# Patient Record
Sex: Female | Born: 1975 | Race: Black or African American | Hispanic: No | Marital: Married | Smoking: Never smoker
Health system: Southern US, Community
[De-identification: ages and names within clinical notes are randomized; demographics above are authoritative.]

## PROBLEM LIST (undated history)

## (undated) DIAGNOSIS — O039 Complete or unspecified spontaneous abortion without complication: Secondary | ICD-10-CM

## (undated) HISTORY — DX: Complete or unspecified spontaneous abortion without complication: O03.9

---

## 2000-11-28 ENCOUNTER — Ambulatory Visit (HOSPITAL_COMMUNITY): Admission: RE | Admit: 2000-11-28 | Discharge: 2000-11-28 | Payer: Self-pay | Admitting: *Deleted

## 2001-04-26 ENCOUNTER — Inpatient Hospital Stay (HOSPITAL_COMMUNITY): Admission: AD | Admit: 2001-04-26 | Discharge: 2001-04-28 | Payer: Self-pay | Admitting: *Deleted

## 2002-02-28 ENCOUNTER — Ambulatory Visit (HOSPITAL_COMMUNITY): Admission: RE | Admit: 2002-02-28 | Discharge: 2002-02-28 | Payer: Self-pay | Admitting: *Deleted

## 2002-07-25 ENCOUNTER — Inpatient Hospital Stay (HOSPITAL_COMMUNITY): Admission: AD | Admit: 2002-07-25 | Discharge: 2002-07-25 | Payer: Self-pay | Admitting: Obstetrics and Gynecology

## 2002-07-25 ENCOUNTER — Inpatient Hospital Stay (HOSPITAL_COMMUNITY): Admission: AD | Admit: 2002-07-25 | Discharge: 2002-07-28 | Payer: Self-pay | Admitting: Obstetrics & Gynecology

## 2009-01-19 ENCOUNTER — Ambulatory Visit: Payer: Self-pay | Admitting: Internal Medicine

## 2010-03-10 ENCOUNTER — Other Ambulatory Visit: Payer: Self-pay | Admitting: Specialist

## 2010-03-10 DIAGNOSIS — R1011 Right upper quadrant pain: Secondary | ICD-10-CM

## 2010-03-17 ENCOUNTER — Ambulatory Visit
Admission: RE | Admit: 2010-03-17 | Discharge: 2010-03-17 | Disposition: A | Discharge status: Home / Self Care | Payer: Medicaid Other | Source: Ambulatory Visit | Attending: Specialist | Admitting: Specialist

## 2010-03-17 DIAGNOSIS — R1011 Right upper quadrant pain: Secondary | ICD-10-CM

## 2010-03-25 ENCOUNTER — Other Ambulatory Visit: Payer: Self-pay | Admitting: Specialist

## 2010-03-25 DIAGNOSIS — R7989 Other specified abnormal findings of blood chemistry: Secondary | ICD-10-CM

## 2010-03-29 ENCOUNTER — Ambulatory Visit
Admission: RE | Admit: 2010-03-29 | Discharge: 2010-03-29 | Disposition: A | Discharge status: Home / Self Care | Payer: Medicaid Other | Source: Ambulatory Visit | Attending: Specialist | Admitting: Specialist

## 2010-03-29 DIAGNOSIS — R7989 Other specified abnormal findings of blood chemistry: Secondary | ICD-10-CM

## 2012-09-12 IMAGING — US US ABDOMEN COMPLETE
1 series · 14 of 25 positions shown · non-contrast
Comparison: None.

CLINICAL DATA: Right upper quadrant abdominal pain, some pelvic
pain, difficulty urinating

COMPLETE ABDOMINAL ULTRASOUND

[Series 1: us abdomen complete · 14 of 87 slices shown]
[im 1/87]
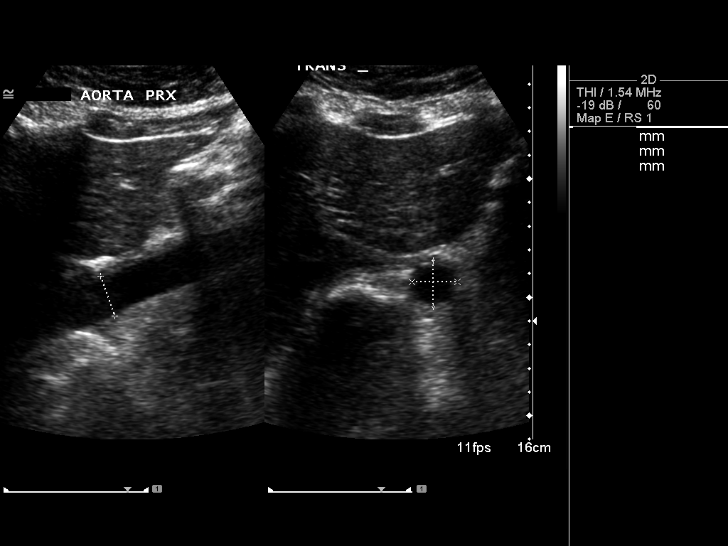
[im 8/87]
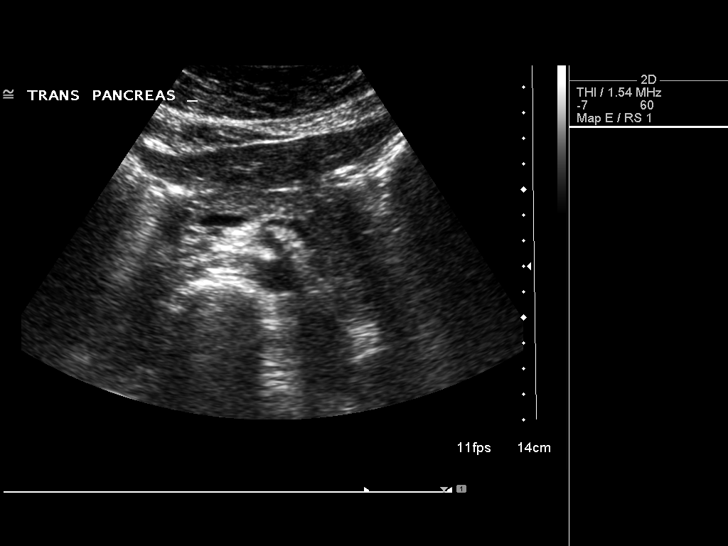
[im 15/87]
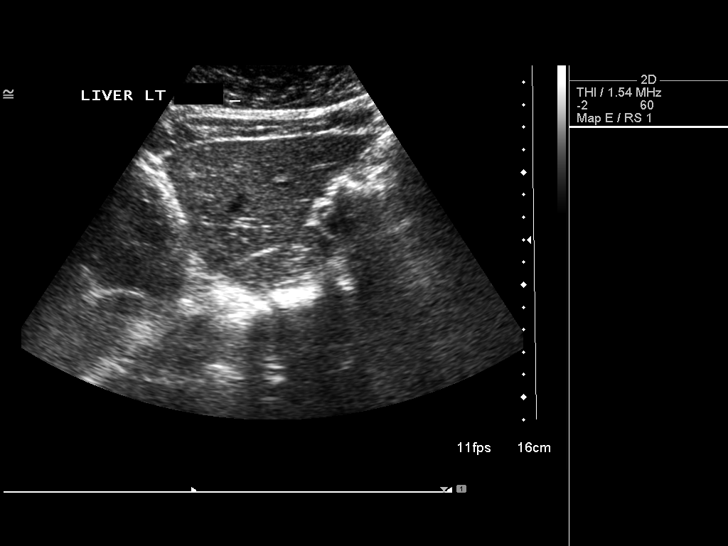
[im 22/87]
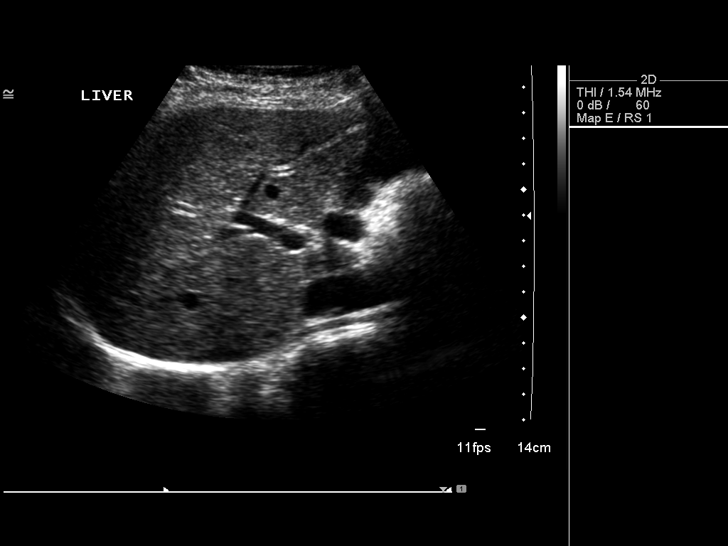
[im 29/87]
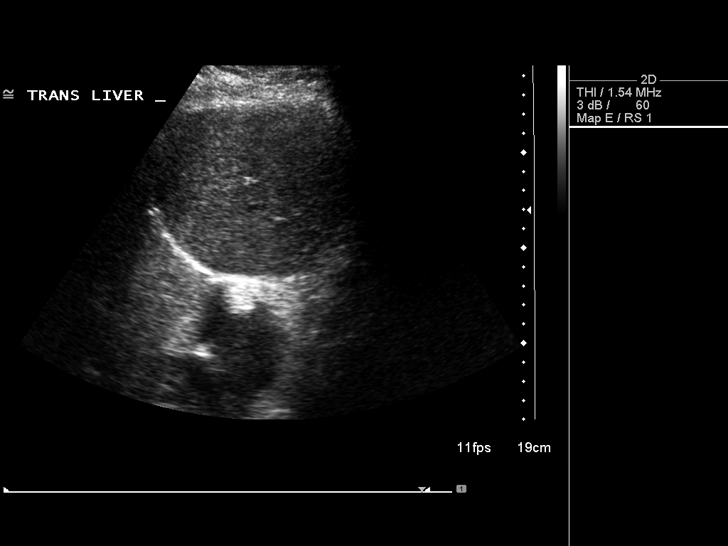
[im 33/87]
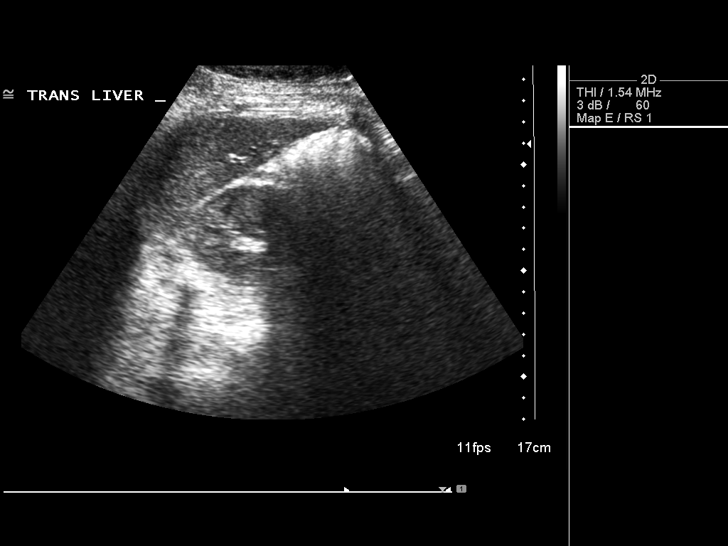
[im 40/87]
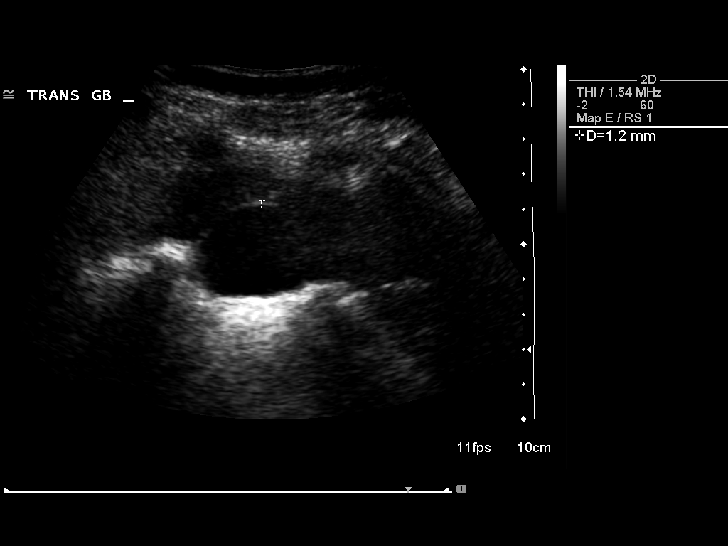
[im 47/87]
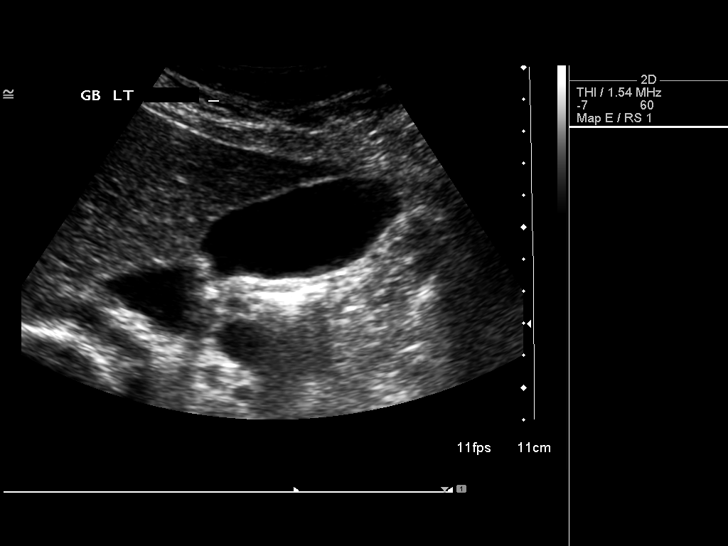
[im 54/87]
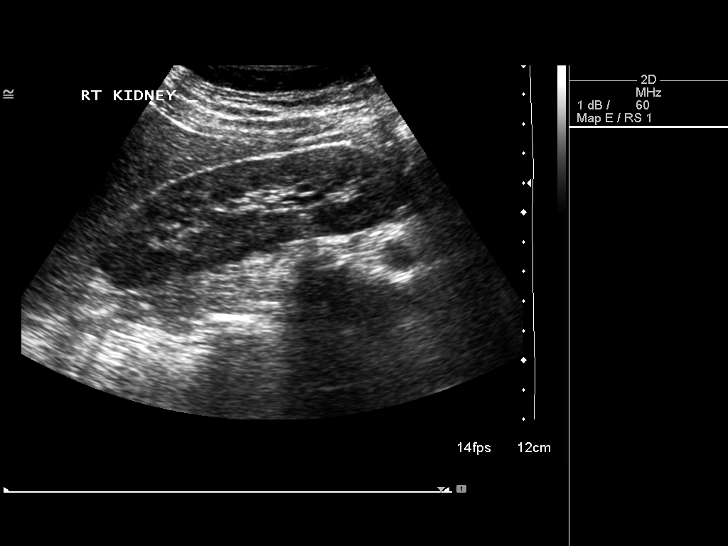
[im 58/87]
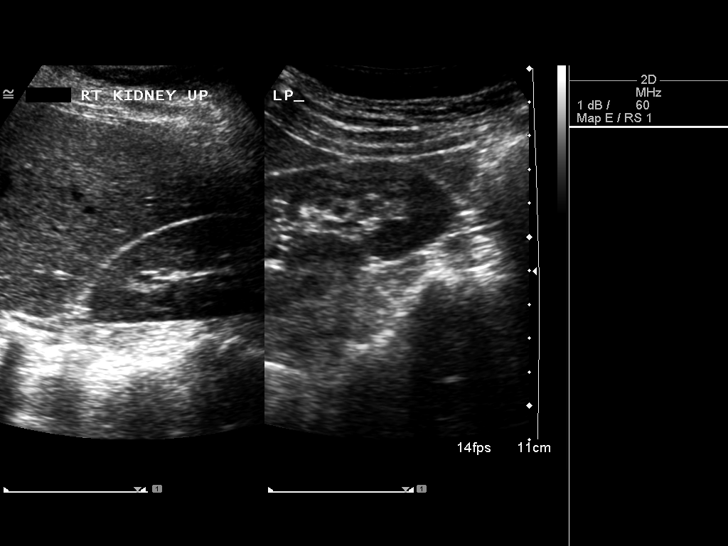
[im 65/87]
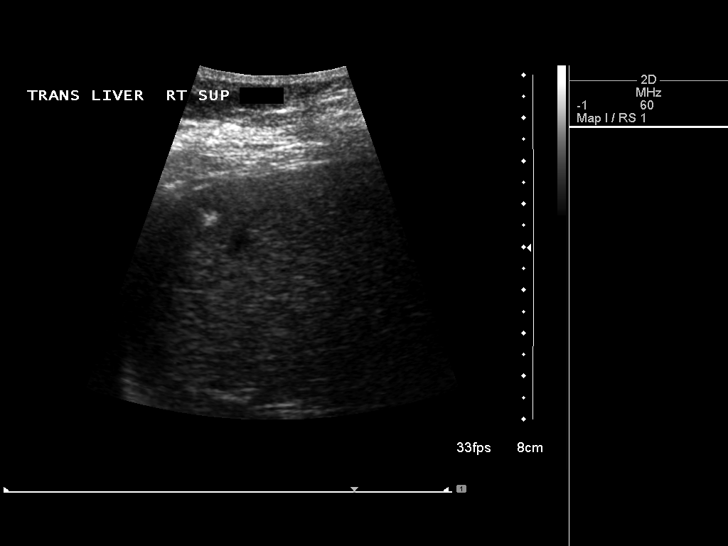
[im 72/87]
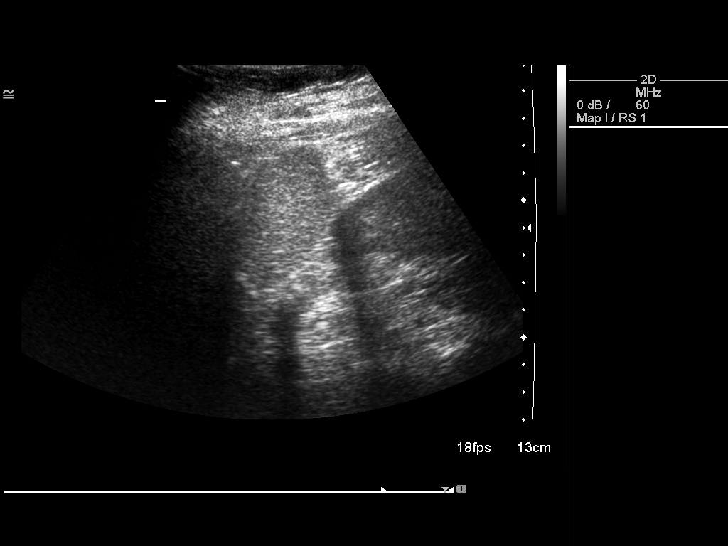
[im 79/87]
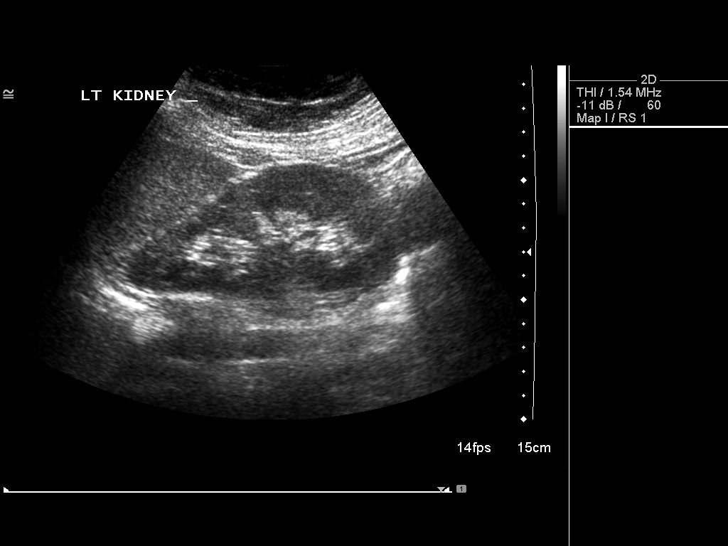
[im 87/87]
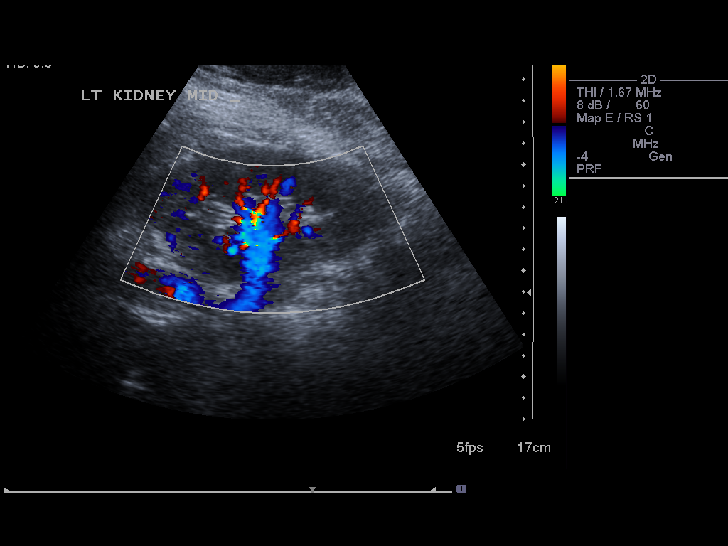

[14 of 25 positions shown; findings below may reference images not displayed]

FINDINGS: Gallbladder:  The gallbladder is visualized and no gallstones are
noted.

Common bile duct:  The common bile duct is normal measuring 5.1 mm
in diameter.

Liver:  The liver appears normal other than a single small
hyperechogenic focus superiorly in the right lobe of 5 mm most
consistent with small hemangioma.  No ductal dilatation is seen.

IVC:  Appears normal.

Pancreas:  The pancreas is partially obscured by bowel gas,
particularly the head and tail of the pancreas.

Spleen:  The spleen is normal measuring 6.3 cm sagittally.

Right Kidney:  No hydronephrosis is seen.  The right kidney
measures 10.8 cm sagittally.There is minimal fullness of the right
pelvis measuring 7 mm in diameter.

Left Kidney:  No hydronephrosis.  The left kidney measures 11.5 cm.

Abdominal aorta:  The abdominal aorta is normal in caliber.
IMPRESSION: 1.  Minimal fullness of the right renal pelvis of questionable
significance.
2.  Probable small hemangioma in the liver of 5 mm in diameter.
3.  No gallstones.

## 2012-09-24 IMAGING — US US SOFT TISSUE HEAD/NECK
1 series · 14 of 25 positions shown · non-contrast
Comparison: None.

CLINICAL DATA: Low TSH.

THYROID ULTRASOUND
TECHNIQUE: Ultrasound examination of the thyroid gland and adjacent
soft tissues was performed.

[Series 1: us soft tissue head/neck · 0.06mm/px · 14 of 36 slices shown]
[im 1/36]
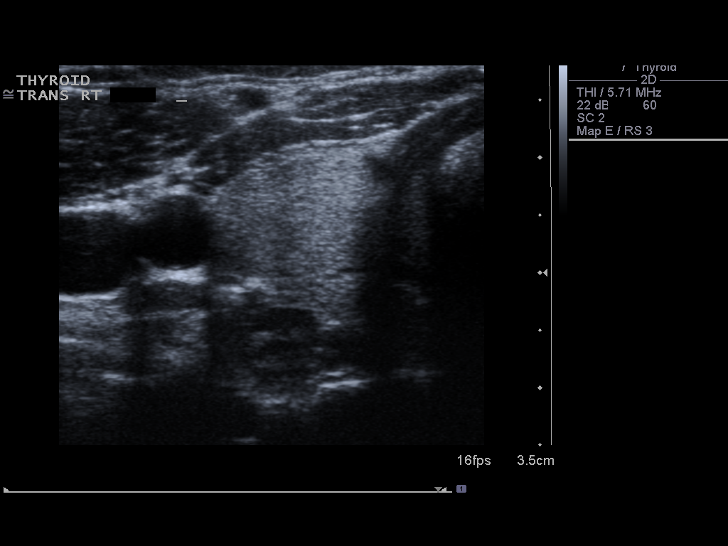
[im 3/36]
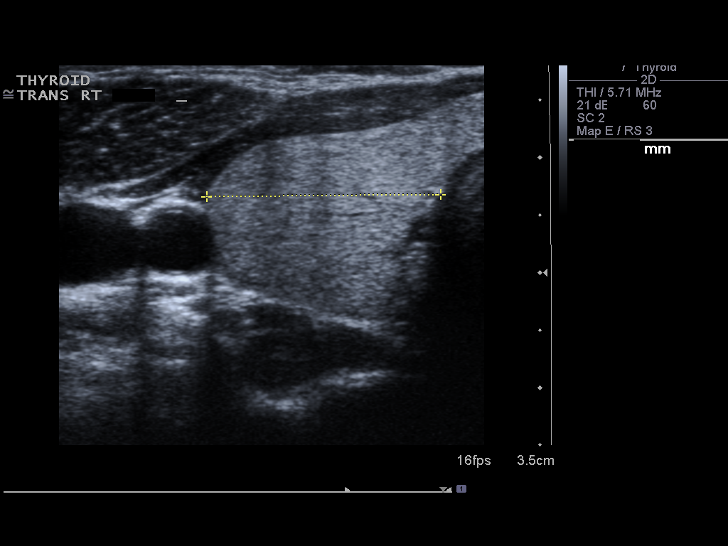
[im 6/36]
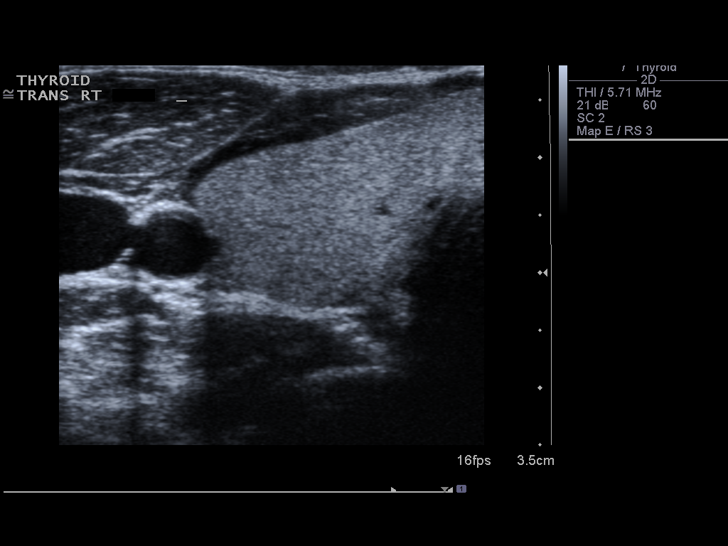
[im 9/36]
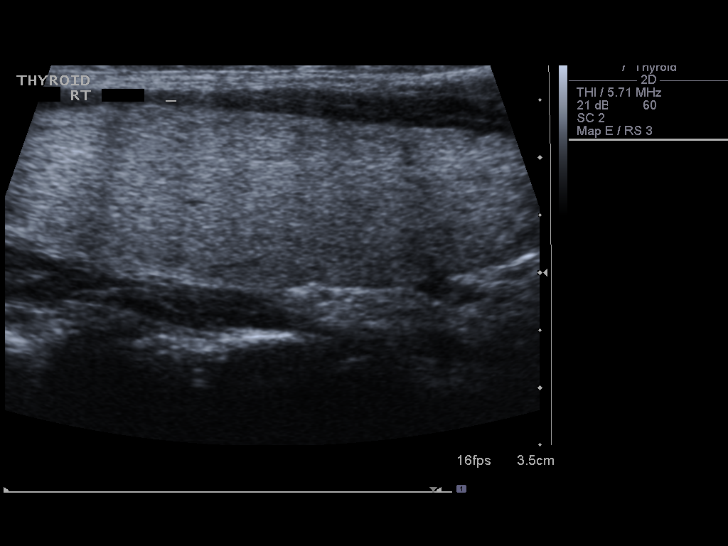
[im 12/36]
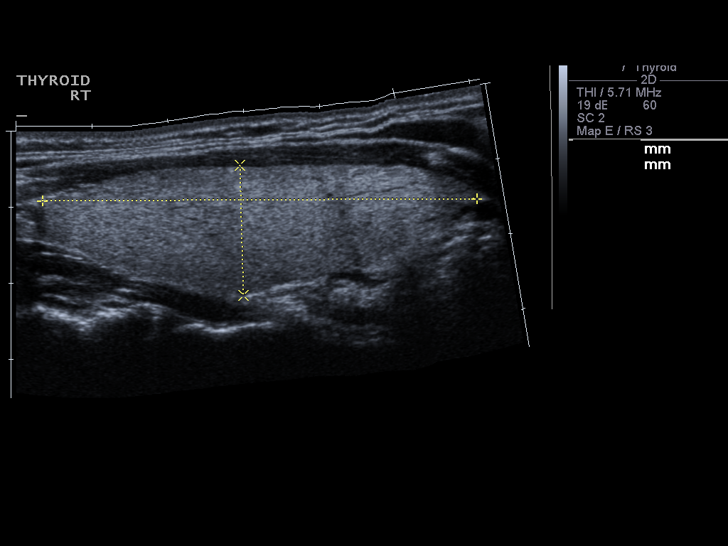
[im 14/36]
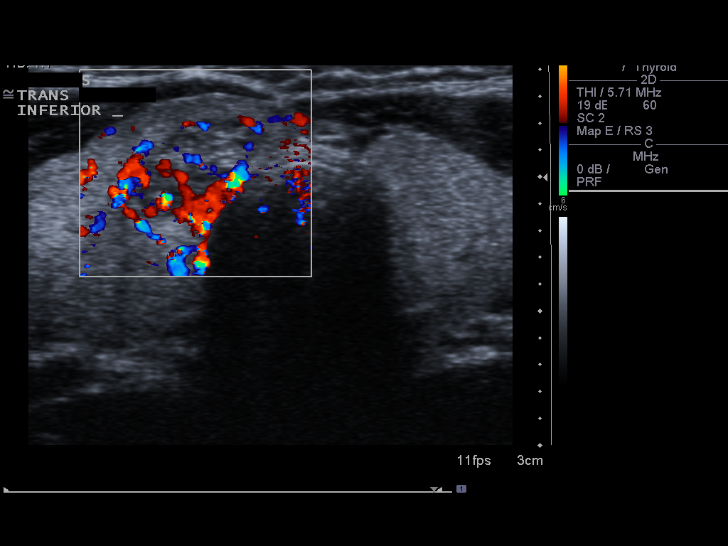
[im 17/36]
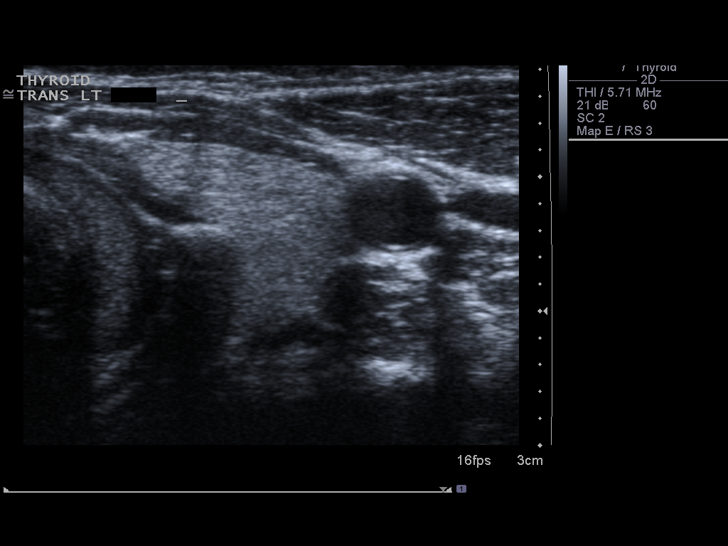
[im 19/36]
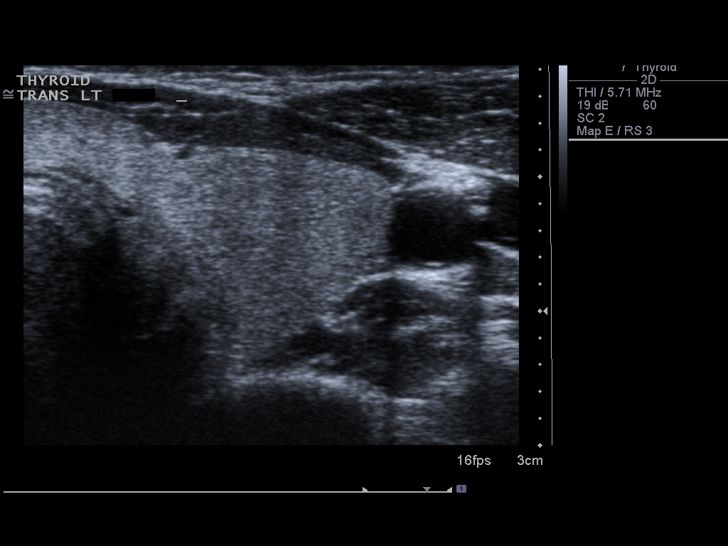
[im 22/36]
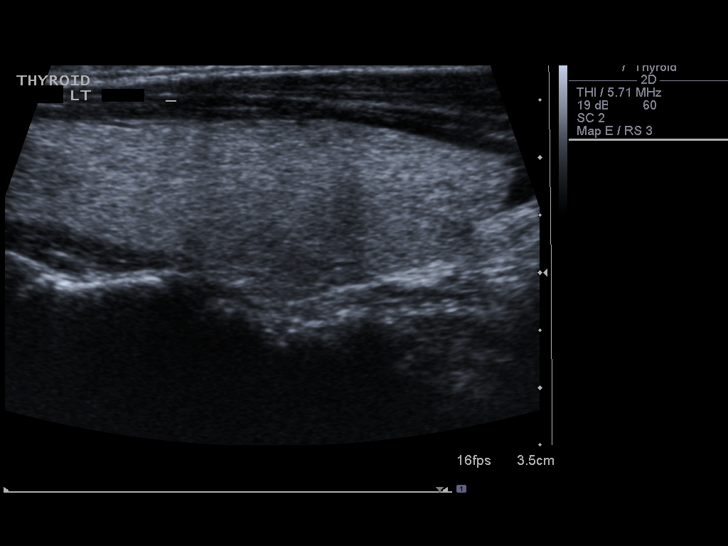
[im 24/36]
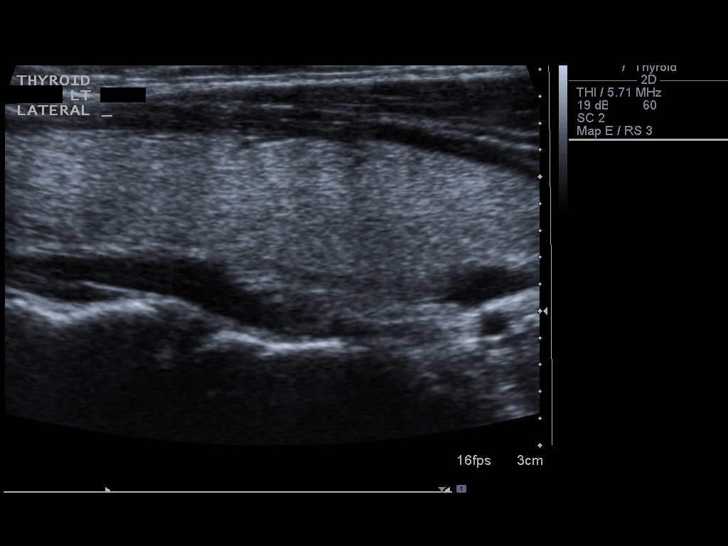
[im 27/36]
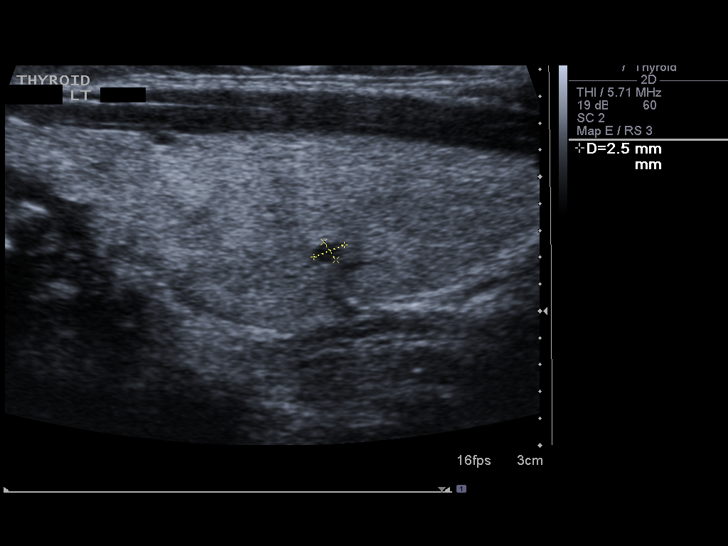
[im 30/36]
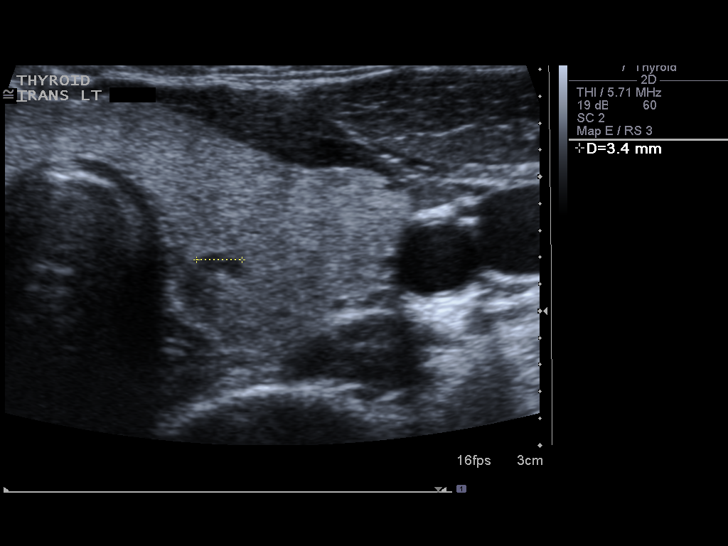
[im 33/36]
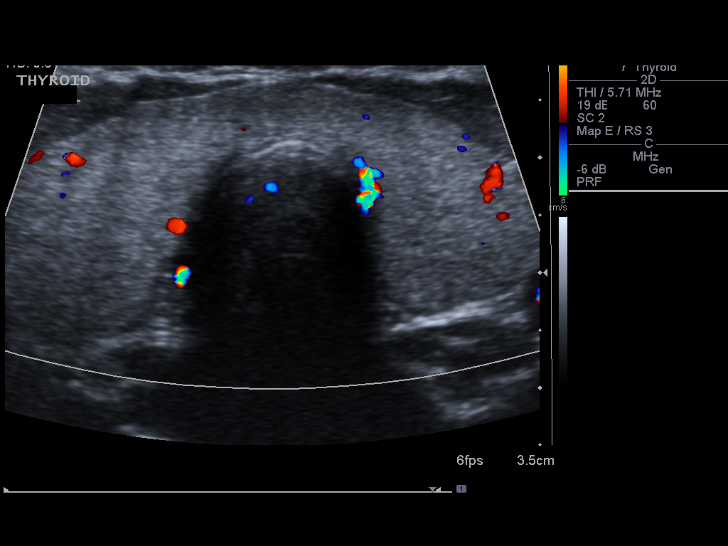
[im 36/36]
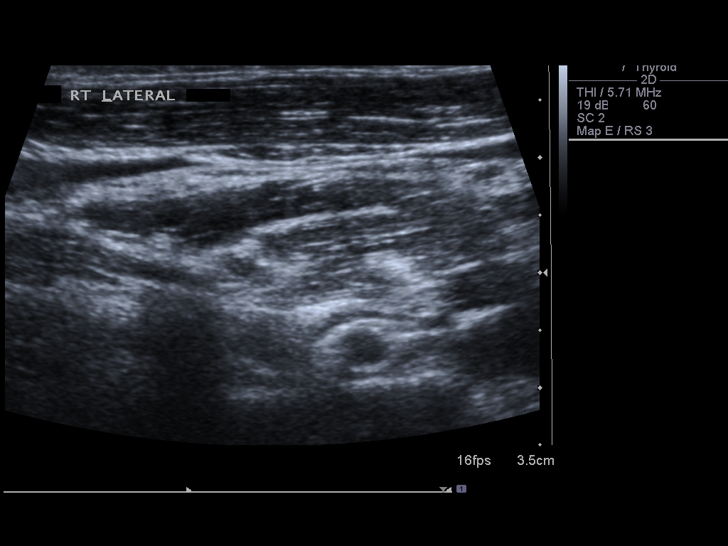

[14 of 25 positions shown; findings below may reference images not displayed]

FINDINGS: Right thyroid lobe:  5.7 x 1.7 x 2.0 cm
Left thyroid lobe:  4.4 x 1.7 x 1.9 cm
Isthmus:  5 mm

Focal nodules:  Thyroid echotexture is primarily homogeneous.
Within the interpolar right lobe, a solid hypoechoic lesion
measures 1.4 x 0.8 by 1.0 cm.  Hypervascular.  No calcifications
within.

A cystic lesion in the interpolar left lobe measures 3 mm and is of
doubtful clinical significance.

Lymphadenopathy:  None visualized.
IMPRESSION: Dominant nodule within the right lobe of the thyroid.  Given the
history of decreased TSH level, consider nuclear medicine
scintigraphy.  If this is not performed, ultrasound surveillance of
this lesion is recommended at approximately 6 months.

## 2017-04-26 DIAGNOSIS — L301 Dyshidrosis [pompholyx]: Secondary | ICD-10-CM | POA: Insufficient documentation

## 2017-04-26 DIAGNOSIS — E789 Disorder of lipoprotein metabolism, unspecified: Secondary | ICD-10-CM | POA: Insufficient documentation

## 2017-04-26 DIAGNOSIS — I8393 Asymptomatic varicose veins of bilateral lower extremities: Secondary | ICD-10-CM | POA: Insufficient documentation

## 2017-04-26 DIAGNOSIS — E041 Nontoxic single thyroid nodule: Secondary | ICD-10-CM | POA: Insufficient documentation

## 2019-05-08 ENCOUNTER — Other Ambulatory Visit (HOSPITAL_COMMUNITY)
Admission: RE | Admit: 2019-05-08 | Discharge: 2019-05-08 | Disposition: A | Discharge status: Home / Self Care | Payer: Federal, State, Local not specified - PPO | Source: Ambulatory Visit | Attending: Obstetrics and Gynecology | Admitting: Obstetrics and Gynecology

## 2019-05-08 ENCOUNTER — Other Ambulatory Visit: Payer: Self-pay

## 2019-05-08 ENCOUNTER — Ambulatory Visit: Payer: Federal, State, Local not specified - PPO | Admitting: Obstetrics and Gynecology

## 2019-05-08 ENCOUNTER — Encounter: Payer: Self-pay | Admitting: Obstetrics and Gynecology

## 2019-05-08 VITALS — BP 118/76 | HR 80 | Temp 97.9°F | Resp 10 | Ht 62.75 in | Wt 197.4 lb

## 2019-05-08 DIAGNOSIS — Z124 Encounter for screening for malignant neoplasm of cervix: Secondary | ICD-10-CM | POA: Insufficient documentation

## 2019-05-08 DIAGNOSIS — Z3041 Encounter for surveillance of contraceptive pills: Secondary | ICD-10-CM

## 2019-05-08 DIAGNOSIS — Z01419 Encounter for gynecological examination (general) (routine) without abnormal findings: Secondary | ICD-10-CM | POA: Diagnosis not present

## 2019-05-08 DIAGNOSIS — Z0000 Encounter for general adult medical examination without abnormal findings: Secondary | ICD-10-CM | POA: Diagnosis not present

## 2019-05-08 MED ORDER — ETHYNODIOL DIAC-ETH ESTRADIOL 1-35 MG-MCG PO TABS: 1.0000 | ORAL_TABLET | Freq: Every day | ORAL | 3 refills | Status: DC

## 2019-05-08 NOTE — Patient Instructions (Signed)

## 2019-05-08 NOTE — Progress Notes (Signed)
44 y.o. [id number] Married Black or African American Not Hispanic or Latino female here as a new patient for an annual exam.   Period Cycle (Days): 28 Period Duration (Days): 5-6 Period Pattern: Regular Menstrual Flow: Moderate Menstrual Control: Thin pad Menstrual Control Change Freq (Hours): 5 Dysmenorrhea: None  Patient's last menstrual period was 04/23/2019 (approximate).          Sexually active: Yes.    The current method of family planning is none.    Exercising: No.  The patient does not participate in regular exercise at present. Smoker:  no  Health Maintenance: Pap:  About 2015 per patient - normal  History of abnormal Pap:  no MMG:  Never -- patient declines BMD:   never Colonoscopy: never TDaP: about 2015 during pregnancy Gardasil: never   reports that she has never smoked. She has never used smokeless tobacco. She reports that she does not drink alcohol or use drugs. Homemaker, kids are [phone] and 5  Past Medical History:  Diagnosis Date  . Miscarriage     History reviewed. No pertinent surgical history.  Current Outpatient Medications  Medication Sig Dispense Refill  . ethynodiol-ethinyl estradiol (ZOVIA) 1-35 MG-MCG tablet Take 1 tablet by mouth daily.     No current facility-administered medications for this visit.    Family History  Problem Relation Age of Onset  . Hypertension Father     Review of Systems  Constitutional: Negative.   HENT: Negative.   Eyes: Negative.   Respiratory: Negative.   Cardiovascular: Negative.   Gastrointestinal: Negative.   Endocrine: Negative.   Genitourinary: Negative.   Musculoskeletal: Negative.   Skin: Negative.   Allergic/Immunologic: Negative.   Neurological: Negative.   Hematological: Negative.   Psychiatric/Behavioral: Negative.     Exam:   BP 118/76 (BP Location: Right Arm, Patient Position: Sitting, Cuff Size: Normal)   Pulse 80   Temp 97.9 F (36.6 C) (Temporal)   Resp 10   Ht 5' 2.75" (1.594  m)   Wt 197 lb 6.4 oz (89.5 kg)   LMP 04/23/2019 (Approximate)   BMI 35.25 kg/m   Weight change: @WEIGHTCHANGE @ Height:   Height: 5' 2.75" (159.4 cm)  Ht Readings from Last 3 Encounters:  05/08/19 5' 2.75" (1.594 m)    General appearance: alert, cooperative and appears stated age Head: Normocephalic, without obvious abnormality, atraumatic Neck: no adenopathy, supple, symmetrical, trachea midline and thyroid normal to inspection and palpation Lungs: clear to auscultation bilaterally Cardiovascular: regular rate and rhythm Breasts: normal appearance, no masses or tenderness Abdomen: soft, non-tender; non distended,  no masses,  no organomegaly Extremities: extremities normal, atraumatic, no cyanosis or edema Skin: Skin color, texture, turgor normal. No rashes or lesions Lymph nodes: Cervical, supraclavicular, and axillary nodes normal. No abnormal inguinal nodes palpated Neurologic: Grossly normal   Pelvic: External genitalia:  no lesions              Urethra:  normal appearing urethra with no masses, tenderness or lesions              Bartholins and Skenes: normal                 Vagina: normal appearing vagina with normal color and discharge, no lesions              Cervix: no lesions               Bimanual Exam:  Uterus:  normal size, contour, position, consistency, mobility, non-tender  Adnexa: no mass, fullness, tenderness               Rectovaginal: Confirms               Anus:  normal sphincter tone, no lesions  Terence Lux chaperoned for the exam.  A:  Well Woman with normal exam  P:   Pap with hpv  Mammogram # given  Discussed breast self exam  Discussed calcium and vit D intake  Screening labs  Continue OCP's

## 2019-05-09 LAB — CBC
Hematocrit: 37.9 % (ref 34.0–46.6)
Hemoglobin: 12.6 g/dL (ref 11.1–15.9)
MCH: 30.9 pg (ref 26.6–33.0)
MCHC: 33.2 g/dL (ref 31.5–35.7)
MCV: 93 fL (ref 79–97)
Platelets: 262 10*3/uL (ref 150–450)
RBC: 4.08 x10E6/uL (ref 3.77–5.28)
RDW: 12.1 % (ref 11.7–15.4)
WBC: 4.9 10*3/uL (ref 3.4–10.8)

## 2019-05-09 LAB — LIPID PANEL
Chol/HDL Ratio: 3.6 ratio (ref 0.0–4.4)
Cholesterol, Total: 200 mg/dL — ABNORMAL HIGH (ref 100–199)
HDL: 56 mg/dL (ref >39)
LDL Chol Calc (NIH): 135 mg/dL — ABNORMAL HIGH (ref 0–99)
Triglycerides: 50 mg/dL (ref 0–149)
VLDL Cholesterol Cal: 9 mg/dL (ref 5–40)

## 2019-05-09 LAB — COMPREHENSIVE METABOLIC PANEL
ALT: 10 IU/L (ref 0–32)
AST: 14 IU/L (ref 0–40)
Albumin/Globulin Ratio: 1.5 (ref 1.2–2.2)
Albumin: 4.1 g/dL (ref 3.8–4.8)
Alkaline Phosphatase: 45 IU/L (ref 39–117)
BUN/Creatinine Ratio: 16 (ref 9–23)
BUN: 11 mg/dL (ref 6–24)
Bilirubin Total: 0.2 mg/dL (ref 0.0–1.2)
CO2: 25 mmol/L (ref 20–29)
Calcium: 9.1 mg/dL (ref 8.7–10.2)
Chloride: 103 mmol/L (ref 96–106)
Creatinine, Ser: 0.67 mg/dL (ref 0.57–1.00)
GFR calc Af Amer: 126 mL/min/{1.73_m2} (ref >59)
GFR calc non Af Amer: 110 mL/min/{1.73_m2} (ref >59)
Globulin, Total: 2.8 g/dL (ref 1.5–4.5)
Glucose: 82 mg/dL (ref 65–99)
Potassium: 3.8 mmol/L (ref 3.5–5.2)
Sodium: 141 mmol/L (ref 134–144)
Total Protein: 6.9 g/dL (ref 6.0–8.5)

## 2019-05-12 LAB — CYTOLOGY - PAP
Comment: NEGATIVE
Diagnosis: NEGATIVE
High risk HPV: POSITIVE — AB

## 2020-01-19 ENCOUNTER — Other Ambulatory Visit: Payer: Self-pay | Admitting: Obstetrics and Gynecology

## 2020-03-30 ENCOUNTER — Telehealth: Payer: Self-pay

## 2020-03-30 MED ORDER — ETHYNODIOL DIAC-ETH ESTRADIOL 1-35 MG-MCG PO TABS: 1.0000 | ORAL_TABLET | Freq: Every day | ORAL | 1 refills | Status: DC

## 2020-03-30 NOTE — Telephone Encounter (Signed)
AEX scheduled for 05/13/20 Last AEX 05/08/19  Needs bcp refill.

## 2020-04-25 ENCOUNTER — Other Ambulatory Visit: Payer: Self-pay | Admitting: Obstetrics and Gynecology

## 2020-04-27 NOTE — Telephone Encounter (Signed)
Annual exam scheduled on 05/13/20

## 2020-05-10 NOTE — Progress Notes (Deleted)
45 y.o. [id number] Married Black or [place] American Not Hispanic or Latino female here for annual exam.      No LMP recorded.          Sexually active: {yes no:314532}  The current method of family planning is {contraception:315051}.    Exercising: {yes no:314532}  {types:19826} Smoker:  {YES [id number]  Health Maintenance: Pap:  05/08/19 Neg Hr HPV +  History of abnormal Pap:  Yes + HPv  MMG:  None patient declined in 2021 BMD:   None  Colonoscopy: none  TDaP:  2015 during pregnancy  Gardasil: none    reports that she has never smoked. She has never used smokeless tobacco. She reports that she does not drink alcohol and does not use drugs.  Past Medical History:  Diagnosis Date  . Miscarriage     No past surgical history on file.  Current Outpatient Medications  Medication Sig Dispense Refill  . [facility] 1/35 1-35 MG-MCG tablet TAKE 1 TABLET BY MOUTH EVERY DAY 28 tablet 0   No current facility-administered medications for this visit.    Family History  Problem Relation Age of Onset  . Hypertension Father     Review of Systems  Exam:   There were no vitals taken for this visit.  Weight change: @WEIGHTCHANGE @ Height:      Ht Readings from Last 3 Encounters:  05/08/19 5' 2.75" (1.594 m)    General appearance: alert, cooperative and appears stated age Head: Normocephalic, without obvious abnormality, atraumatic Neck: no adenopathy, supple, symmetrical, trachea midline and thyroid {CHL AMB PHY EX THYROID NORM [phone]::"normal to inspection and palpation"} Lungs: clear to auscultation bilaterally Cardiovascular: regular rate and rhythm Breasts: {Exam; breast:13139::"normal appearance, no masses or tenderness"} Abdomen: soft, non-tender; non distended,  no masses,  no organomegaly Extremities: extremities normal, atraumatic, no cyanosis or edema Skin: Skin color, texture, turgor normal. No rashes or lesions Lymph nodes: Cervical, supraclavicular, and axillary  nodes normal. No abnormal inguinal nodes palpated Neurologic: Grossly normal   Pelvic: External genitalia:  no lesions              Urethra:  normal appearing urethra with no masses, tenderness or lesions              Bartholins and Skenes: normal                 Vagina: normal appearing vagina with normal color and discharge, no lesions              Cervix: {CHL AMB PHY EX CERVIX NORM DEFAULT:[phone]::"no lesions"}               Bimanual Exam:  Uterus:  {CHL AMB PHY EX UTERUS NORM [phone]::"normal size, contour, position, consistency, mobility, non-tender"}              Adnexa: {CHL AMB PHY EX ADNEXA NO MASS DEFAULT:[phone]::"no mass, fullness, tenderness"}               Rectovaginal: Confirms               Anus:  normal sphincter tone, no lesions  *** chaperoned for the exam.  A:  Well Woman with normal exam  P:

## 2020-05-13 ENCOUNTER — Ambulatory Visit: Payer: Self-pay | Admitting: Obstetrics and Gynecology

## 2020-05-13 ENCOUNTER — Telehealth: Payer: Self-pay

## 2020-05-13 MED ORDER — ETHYNODIOL DIAC-ETH ESTRADIOL 1-35 MG-MCG PO TABS: 1.0000 | ORAL_TABLET | Freq: Every day | ORAL | 0 refills | Status: DC

## 2020-05-13 NOTE — Telephone Encounter (Signed)
Please let the patient know that her script has been sent.

## 2020-05-13 NOTE — Telephone Encounter (Signed)
Left message in voice mail that Rx sent.

## 2020-05-13 NOTE — Telephone Encounter (Signed)
"  This patient originally had appt scheduled with Dr. Oscar La today but the office had needed to reschedule it. We had left messages for her to r/s but she failed to call back and came in today."   She needs her birth control pills called into her pharmacy to cover her until her AEXon 08/19/20.  Last AEX 05/08/2019    (Per Toniann Fail I need to call patient and let her know Rx sent.)

## 2020-08-13 ENCOUNTER — Other Ambulatory Visit: Payer: Self-pay | Admitting: Obstetrics and Gynecology

## 2020-08-13 NOTE — Telephone Encounter (Signed)
Annual exam scheduled on 08/19/20 

## 2020-08-17 NOTE — Progress Notes (Deleted)
45 y.o. [id number] Married Black or [place] American Not Hispanic or Latino female here for annual exam.      No LMP recorded.          Sexually active: {yes no:314532}  The current method of family planning is {contraception:315051}.    Exercising: {yes no:314532}  {types:19826} Smoker:  {YES NO:22349}  Health Maintenance: Pap:  2015 per patient - normal   05/08/19 Normal Hr HPV + History of abnormal Pap:  no MMG:  *** BMD:   N/A Colonoscopy: none  TDaP:  2015 when pregnant Gardasil: never    reports that she has never smoked. She has never used smokeless tobacco. She reports that she does not drink alcohol and does not use drugs.  Past Medical History:  Diagnosis Date   Miscarriage     No past surgical history on file.  Current Outpatient Medications  Medication Sig Dispense Refill   KELNOR 1/35 1-35 MG-MCG tablet TAKE 1 TABLET BY MOUTH EVERY DAY 84 tablet 0   No current facility-administered medications for this visit.    Family History  Problem Relation Age of Onset   Hypertension Father     Review of Systems  Exam:   There were no vitals taken for this visit.  Weight change: @WEIGHTCHANGE @ Height:      Ht Readings from Last 3 Encounters:  05/08/19 5' 2.75" (1.594 m)    General appearance: alert, cooperative and appears stated age Head: Normocephalic, without obvious abnormality, atraumatic Neck: no adenopathy, supple, symmetrical, trachea midline and thyroid {CHL AMB PHY EX THYROID NORM [phone]} Lungs: clear to auscultation bilaterally Cardiovascular: regular rate and rhythm Breasts: {Exam; breast:13139} Abdomen: soft, non-tender; non distended,  no masses,  no organomegaly Extremities: extremities normal, atraumatic, no cyanosis or edema Skin: Skin color, texture, turgor normal. No rashes or lesions Lymph nodes: Cervical, supraclavicular, and axillary nodes normal. No abnormal inguinal nodes palpated Neurologic: Grossly normal   Pelvic:  External genitalia:  no lesions              Urethra:  normal appearing urethra with no masses, tenderness or lesions              Bartholins and Skenes: normal                 Vagina: normal appearing vagina with normal color and discharge, no lesions              Cervix: {CHL AMB PHY EX CERVIX NORM DEFAULT:[phone]}               Bimanual Exam:  Uterus:  {CHL AMB PHY EX UTERUS NORM DEFAULT:[phone]}              Adnexa: {CHL AMB PHY EX ADNEXA NO MASS DEFAULT:[phone]}               Rectovaginal: Confirms               Anus:  normal sphincter tone, no lesions  *** chaperoned for the exam.  A:  Well Woman with normal exam  P:

## 2020-08-19 ENCOUNTER — Ambulatory Visit: Payer: Federal, State, Local not specified - PPO | Admitting: Obstetrics and Gynecology

## 2020-08-31 ENCOUNTER — Ambulatory Visit: Payer: Federal, State, Local not specified - PPO | Admitting: Obstetrics and Gynecology

## 2020-11-13 ENCOUNTER — Other Ambulatory Visit: Payer: Self-pay | Admitting: Obstetrics and Gynecology

## 2020-11-21 ENCOUNTER — Other Ambulatory Visit: Payer: Self-pay | Admitting: Obstetrics and Gynecology

## 2020-11-22 ENCOUNTER — Other Ambulatory Visit: Payer: Self-pay | Admitting: Obstetrics and Gynecology

## 2020-11-22 ENCOUNTER — Other Ambulatory Visit: Payer: Self-pay | Admitting: *Deleted

## 2020-11-22 NOTE — Telephone Encounter (Signed)
Patient informed with below note. Aware that Rx has been denied.

## 2020-11-22 NOTE — Telephone Encounter (Signed)
It is completely her choice to have or not to have mammograms. I'm not comfortable prescribing OCP's on someone who isn't getting mammograms because of the increased the risk of breast cancer.  She could use condoms or consider a non-hormonal type of contraception if she doesn't want to have mammograms.

## 2020-11-22 NOTE — Telephone Encounter (Signed)
Patient has annual exam scheduled on 01/2021 called requesting refill on Kelonor 1/35 mcg tablet pill. Patient has never had mammogram. I did inform her the importance of scheduling this. Patient said you did speak with her about screening mammogram at annual exam last year. She reports she would rather wait a little longer to schedule this. Please advise

## 2021-01-20 NOTE — Progress Notes (Deleted)
45 y.o. [id number] Married Black or [place] American Not Hispanic or Latino female here for annual exam.      No LMP recorded.          Sexually active: {yes no:314532}  The current method of family planning is {contraception:315051}.    Exercising: {yes no:314532}  {types:19826} Smoker:  {YES [id number]  Health Maintenance: Pap:  05/08/19 negative Hr HPV +   About 2015 per patient - normal  History of abnormal Pap:  no MMG:  never- patient declines  BMD:   never  Colonoscopy: none  TDaP:  about 2015 during pregnancy  Gardasil: never    reports that she has never smoked. She has never used smokeless tobacco. She reports that she does not drink alcohol and does not use drugs.  Past Medical History:  Diagnosis Date   Miscarriage     No past surgical history on file.  Current Outpatient Medications  Medication Sig Dispense Refill   KELNOR 1/35 1-35 MG-MCG tablet TAKE 1 TABLET BY MOUTH EVERY DAY 84 tablet 0   No current facility-administered medications for this visit.    Family History  Problem Relation Age of Onset   Hypertension Father     Review of Systems  Exam:   There were no vitals taken for this visit.  Weight change: @WEIGHTCHANGE @ Height:      Ht Readings from Last 3 Encounters:  05/08/19 5' 2.75" (1.594 m)    General appearance: alert, cooperative and appears stated age Head: Normocephalic, without obvious abnormality, atraumatic Neck: no adenopathy, supple, symmetrical, trachea midline and thyroid {CHL AMB PHY EX THYROID NORM DEFAULT:[phone]::"normal to inspection and palpation"} Lungs: clear to auscultation bilaterally Cardiovascular: regular rate and rhythm Breasts: {Exam; breast:13139::"normal appearance, no masses or tenderness"} Abdomen: soft, non-tender; non distended,  no masses,  no organomegaly Extremities: extremities normal, atraumatic, no cyanosis or edema Skin: Skin color, texture, turgor normal. No rashes or lesions Lymph nodes: Cervical,  supraclavicular, and axillary nodes normal. No abnormal inguinal nodes palpated Neurologic: Grossly normal   Pelvic: External genitalia:  no lesions              Urethra:  normal appearing urethra with no masses, tenderness or lesions              Bartholins and Skenes: normal                 Vagina: normal appearing vagina with normal color and discharge, no lesions              Cervix: {CHL AMB PHY EX CERVIX NORM DEFAULT:[phone]::"no lesions"}               Bimanual Exam:  Uterus:  {CHL AMB PHY EX UTERUS NORM DEFAULT:[phone]::"normal size, contour, position, consistency, mobility, non-tender"}              Adnexa: {CHL AMB PHY EX ADNEXA NO MASS DEFAULT:[phone]::"no mass, fullness, tenderness"}               Rectovaginal: Confirms               Anus:  normal sphincter tone, no lesions  *** chaperoned for the exam.  A:  Well Woman with normal exam  P:

## 2021-01-25 ENCOUNTER — Ambulatory Visit: Payer: Federal, State, Local not specified - PPO | Admitting: Obstetrics and Gynecology

## 2021-01-25 DIAGNOSIS — Z0289 Encounter for other administrative examinations: Secondary | ICD-10-CM
